# Patient Record
Sex: Female | Born: 1990 | Hispanic: No | Marital: Married | Smoking: Never smoker
Health system: Southern US, Community
[De-identification: ages and names within clinical notes are randomized; demographics above are authoritative.]

---

## 2018-07-12 ENCOUNTER — Other Ambulatory Visit: Payer: Self-pay

## 2018-07-12 ENCOUNTER — Encounter (HOSPITAL_BASED_OUTPATIENT_CLINIC_OR_DEPARTMENT_OTHER): Payer: Self-pay | Admitting: Emergency Medicine

## 2018-07-12 ENCOUNTER — Emergency Department (HOSPITAL_BASED_OUTPATIENT_CLINIC_OR_DEPARTMENT_OTHER)
Admission: EM | Admit: 2018-07-12 | Discharge: 2018-07-13 | Disposition: A | Discharge status: Home / Self Care | Payer: BLUE CROSS/BLUE SHIELD | Attending: Emergency Medicine | Admitting: Emergency Medicine

## 2018-07-12 DIAGNOSIS — O209 Hemorrhage in early pregnancy, unspecified: Secondary | ICD-10-CM | POA: Diagnosis present

## 2018-07-12 DIAGNOSIS — R8271 Bacteriuria: Secondary | ICD-10-CM

## 2018-07-12 DIAGNOSIS — Z3201 Encounter for pregnancy test, result positive: Secondary | ICD-10-CM | POA: Insufficient documentation

## 2018-07-12 DIAGNOSIS — O2341 Unspecified infection of urinary tract in pregnancy, first trimester: Secondary | ICD-10-CM | POA: Diagnosis not present

## 2018-07-12 DIAGNOSIS — Z3A11 11 weeks gestation of pregnancy: Secondary | ICD-10-CM | POA: Insufficient documentation

## 2018-07-12 LAB — URINALYSIS, ROUTINE W REFLEX MICROSCOPIC
Bilirubin Urine: NEGATIVE
Glucose, UA: NEGATIVE mg/dL
KETONES UR: NEGATIVE mg/dL
Leukocytes,Ua: NEGATIVE
Nitrite: NEGATIVE
Protein, ur: NEGATIVE mg/dL
Specific Gravity, Urine: <1.005 — ABNORMAL LOW (ref 1.005–1.030)
pH: 6.5 (ref 5.0–8.0)

## 2018-07-12 LAB — URINALYSIS, MICROSCOPIC (REFLEX)

## 2018-07-12 LAB — PREGNANCY, URINE: Preg Test, Ur: POSITIVE — AB

## 2018-07-12 MED ORDER — ALBUTEROL SULFATE (2.5 MG/3ML) 0.083% IN NEBU: 2.5000 mg | INHALATION_SOLUTION | Freq: Once | RESPIRATORY_TRACT | Status: DC

## 2018-07-12 NOTE — ED Triage Notes (Signed)
Pt states she is [redacted] weeks pregnant and she started bleeding one hour ago.

## 2018-07-13 ENCOUNTER — Ambulatory Visit (HOSPITAL_BASED_OUTPATIENT_CLINIC_OR_DEPARTMENT_OTHER)
Admission: RE | Admit: 2018-07-13 | Discharge: 2018-07-13 | Disposition: A | Discharge status: Home / Self Care | Payer: BLUE CROSS/BLUE SHIELD | Source: Ambulatory Visit | Attending: Emergency Medicine | Admitting: Emergency Medicine

## 2018-07-13 ENCOUNTER — Encounter (HOSPITAL_BASED_OUTPATIENT_CLINIC_OR_DEPARTMENT_OTHER): Payer: Self-pay

## 2018-07-13 DIAGNOSIS — O9989 Other specified diseases and conditions complicating pregnancy, childbirth and the puerperium: Secondary | ICD-10-CM | POA: Insufficient documentation

## 2018-07-13 DIAGNOSIS — Z3A11 11 weeks gestation of pregnancy: Secondary | ICD-10-CM

## 2018-07-13 DIAGNOSIS — R102 Pelvic and perineal pain: Secondary | ICD-10-CM

## 2018-07-13 DIAGNOSIS — O209 Hemorrhage in early pregnancy, unspecified: Secondary | ICD-10-CM | POA: Insufficient documentation

## 2018-07-13 LAB — CBC WITH DIFFERENTIAL/PLATELET
Abs Immature Granulocytes: 0.02 10*3/uL (ref 0.00–0.07)
Basophils Absolute: 0 10*3/uL (ref 0.0–0.1)
Basophils Relative: 0 %
EOS ABS: 0.1 10*3/uL (ref 0.0–0.5)
Eosinophils Relative: 1 %
HEMATOCRIT: 36.6 % (ref 36.0–46.0)
Hemoglobin: 12.1 g/dL (ref 12.0–15.0)
Immature Granulocytes: 0 %
Lymphocytes Relative: 38 %
Lymphs Abs: 2.9 10*3/uL (ref 0.7–4.0)
MCH: 28 pg (ref 26.0–34.0)
MCHC: 33.1 g/dL (ref 30.0–36.0)
MCV: 84.7 fL (ref 80.0–100.0)
Monocytes Absolute: 0.5 10*3/uL (ref 0.1–1.0)
Monocytes Relative: 7 %
Neutro Abs: 4 10*3/uL (ref 1.7–7.7)
Neutrophils Relative %: 54 %
PLATELETS: 264 10*3/uL (ref 150–400)
RBC: 4.32 MIL/uL (ref 3.87–5.11)
RDW: 13.2 % (ref 11.5–15.5)
WBC: 7.6 10*3/uL (ref 4.0–10.5)
nRBC: 0 % (ref 0.0–0.2)

## 2018-07-13 LAB — HCG, QUANTITATIVE, PREGNANCY: hCG, Beta Chain, Quant, S: 72206 m[IU]/mL — ABNORMAL HIGH

## 2018-07-13 LAB — WET PREP, GENITAL
Clue Cells Wet Prep HPF POC: NONE SEEN
Sperm: NONE SEEN
Trich, Wet Prep: NONE SEEN
Yeast Wet Prep HPF POC: NONE SEEN

## 2018-07-13 MED ORDER — CEPHALEXIN 500 MG PO CAPS: 500.0000 mg | ORAL_CAPSULE | Freq: Four times a day (QID) | ORAL | 0 refills | Status: AC

## 2018-07-13 NOTE — Discharge Instructions (Signed)
We've scheduled an outpatient ultrasound for you tomorrow. Come to the ER here at [facility] during business hours and notify them of your visit and ultrasound order.  Take a prenatal vitamin.  We also noticed a small amount of bacteria in your urine. This should be treated during pregnancy and I've prescribed an antibiotic.

## 2018-07-13 NOTE — ED Provider Notes (Signed)
[facility] EMERGENCY DEPARTMENT Provider Note   CSN: [id number] Arrival date & time: 07/12/18  2116    History   Chief Complaint Chief Complaint  Patient presents with  . Vaginal Bleeding    HPI Kimberly Morrison is a 28 y.o. female.     HPI   28 yo G2P1 at estimated [redacted] wk GA here w/ vaginal bleeding. Pt states she was in her usual state of health until just prior to arrival. She was sitting down when she felt warmth in her legs, which she looked down and realized was vaginal bleeding. She went to the bathroom and had a small to mod amount of vag bleeding. It then resolved and has not returned. Denies any vaginal discharge. No abd pain or cramping. No nausea currently, has had vomiting in AMs which occurred with her other pregnancy as well. No dyspareunia. No other complaints. No fever or chills. No h/o miscarriages.  History reviewed. No pertinent past medical history.  There are no active problems to display for this patient.   Past Surgical History:  Procedure Laterality Date  . CESAREAN SECTION       OB History    Gravida  1   Para      Term      Preterm      AB      Living        SAB      TAB      Ectopic      Multiple      Live Births               Home Medications    Prior to Admission medications   Medication Sig Start Date End Date Taking? Authorizing Provider  cephALEXin (KEFLEX) 500 MG capsule Take 1 capsule (500 mg total) by mouth 4 (four) times daily for 5 days. 07/13/18 07/18/18  Shaune Pollack, MD    Family History History reviewed. No pertinent family history.  Social History Social History   Tobacco Use  . Smoking status: Never Smoker  . Smokeless tobacco: Never Used  Substance Use Topics  . Alcohol use: Never    Frequency: Never  . Drug use: Never     Allergies   Patient has no known allergies.   Review of Systems Review of Systems  Constitutional: Negative for chills and fever.  HENT: Negative  for congestion, rhinorrhea and sore throat.   Eyes: Negative for visual disturbance.  Respiratory: Negative for cough, shortness of breath and wheezing.   Cardiovascular: Negative for chest pain and leg swelling.  Gastrointestinal: Negative for abdominal pain, diarrhea, nausea and vomiting.  Genitourinary: Positive for vaginal bleeding. Negative for dysuria, flank pain and vaginal discharge.  Musculoskeletal: Negative for neck pain.  Skin: Negative for rash.  Allergic/Immunologic: Negative for immunocompromised state.  Neurological: Negative for syncope and headaches.  Hematological: Does not bruise/bleed easily.  All other systems reviewed and are negative.    Physical Exam Updated Vital Signs BP 122/76 (BP Location: Right Arm)   Pulse 68   Temp 98.4 F (36.9 C)   Resp 16   Ht 5\' 2"  (1.575 m)   Wt 74.8 kg   LMP 04/18/2018   SpO2 100%   BMI 30.18 kg/m   Physical Exam Vitals signs and nursing note reviewed.  Constitutional:      General: She is not in acute distress.    Appearance: She is well-developed.  HENT:     Head: Normocephalic and atraumatic.  Eyes:     Conjunctiva/sclera: Conjunctivae normal.  Neck:     Musculoskeletal: Neck supple.  Cardiovascular:     Rate and Rhythm: Normal rate and regular rhythm.     Heart sounds: Normal heart sounds. No murmur. No friction rub.  Pulmonary:     Effort: Pulmonary effort is normal. No respiratory distress.     Breath sounds: Normal breath sounds. No wheezing or rales.  Abdominal:     General: There is no distension.     Palpations: Abdomen is soft.     Tenderness: There is no abdominal tenderness.     Comments: Soft, non-tender, no rebound or guarding.  Genitourinary:    Comments: Small amount of dark red/brown blood in vaginal vault. Os is closed. No CMT. No adnexal fullness. Skin:    General: Skin is warm.     Capillary Refill: Capillary refill takes less than 2 seconds.  Neurological:     Mental Status: She is  alert and oriented to person, place, and time.     Motor: No abnormal muscle tone.      ED Treatments / Results  Labs (all labs ordered are listed, but only abnormal results are displayed) Labs Reviewed  WET PREP, GENITAL - Abnormal; Notable for the following components:      Result Value   WBC, Wet Prep HPF POC MODERATE (*)    All other components within normal limits  PREGNANCY, URINE - Abnormal; Notable for the following components:   Preg Test, Ur POSITIVE (*)    All other components within normal limits  URINALYSIS, ROUTINE W REFLEX MICROSCOPIC - Abnormal; Notable for the following components:   Color, Urine STRAW (*)    Specific Gravity, Urine <1.005 (*)    Hgb urine dipstick LARGE (*)    All other components within normal limits  HCG, QUANTITATIVE, PREGNANCY - Abnormal; Notable for the following components:   hCG, Beta Chain, Quant, S 72,206 (*)    All other components within normal limits  URINALYSIS, MICROSCOPIC (REFLEX) - Abnormal; Notable for the following components:   Bacteria, UA FEW (*)    All other components within normal limits  CBC WITH DIFFERENTIAL/PLATELET    EKG None  Radiology No results found.  Procedures Procedures (including critical care time)  EMERGENCY DEPARTMENT [place] PREGNANCY "Study: Limited Ultrasound of the Pelvis for Pregnancy"  INDICATIONS:Pregnancy(required) Multiple views of the uterus and pelvic cavity were obtained in real-time with a multi-frequency probe.  APPROACH:Transabdominal  PERFORMED BY: Myself IMAGES ARCHIVED?: Yes LIMITATIONS: Body habitus PREGNANCY FREE FLUID: None ADNEXAL FINDINGS: No obvious cyst or free fluid GESTATIONAL AGE, ESTIMATE: 14w FETAL HEART RATE: 160 INTERPRETATION: Fetal heart activity seen  Medications Ordered in ED Medications - No data to display   Initial Impression / Assessment and Plan / ED Course  I have reviewed the triage vital signs and the nursing notes.  Pertinent labs & imaging  results that were available during my care of the patient were reviewed by me and considered in my medical decision making (see chart for details).        27 yo F here w/ painless vaginal bleeding. Suspect threatened AB versus subchorionic hemorrhage. BSUS shows what appears to be IUP w/ HR 150s, good fetal movement noted. No obvious adnexal masses, cysts, or fluid. No large free fluid in cul-de-sac. Labs reassuring, Hgb is stable. Labs reviewed from last pregnancy - pt is Rh +, Rhogam not indicated. No signs of yeast infection on Wet Prep. Small bacteria on  UA - will tx. Will refer for formal TVUS in AM given no signs of ectopic or HD instability, and resolution of bleeding. Prenatals started. She has OB f/u in 48 hours already scheduled.  Final Clinical Impressions(s) / ED Diagnoses   Final diagnoses:  Bacteriuria  First trimester bleeding    ED Discharge Orders         Ordered    cephALEXin (KEFLEX) 500 MG capsule  4 times daily     07/13/18 0107    US OB LESS THAN 14 WEEKS WITH OB TRANSVAGINAL     07/13/18 0107           Shaune Pollack, MD 07/13/18 [phone]

## 2020-01-17 IMAGING — US OBSTETRIC <14 WK US AND TRANSVAGINAL OB US
1 series · 14 of 28 positions shown · non-contrast
Comparison: None.

CLINICAL DATA: Pelvic pain and vaginal bleeding began yesterday.
[place]itative [place] [address]. By LMP of 04/18/2018 the patient is
12 weeks 2 days. EDC by LMP is 01/23/2019.

EXAM:
OBSTETRIC <14 WK US AND TRANSVAGINAL OB US
TECHNIQUE: Both transabdominal and transvaginal ultrasound examinations were
performed for complete evaluation of the gestation as well as the
maternal uterus, adnexal regions, and pelvic cul-de-sac.
Transvaginal technique was performed to assess early pregnancy.

[Series 1: obstetric <14 wk us and transvaginal ob us · 112 acquisitions, 14 frames shown]
[im 5/112]
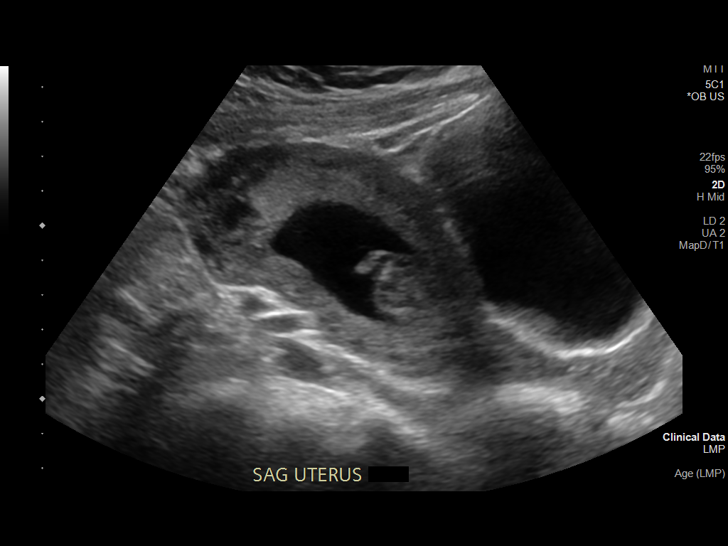
[im 13/112]
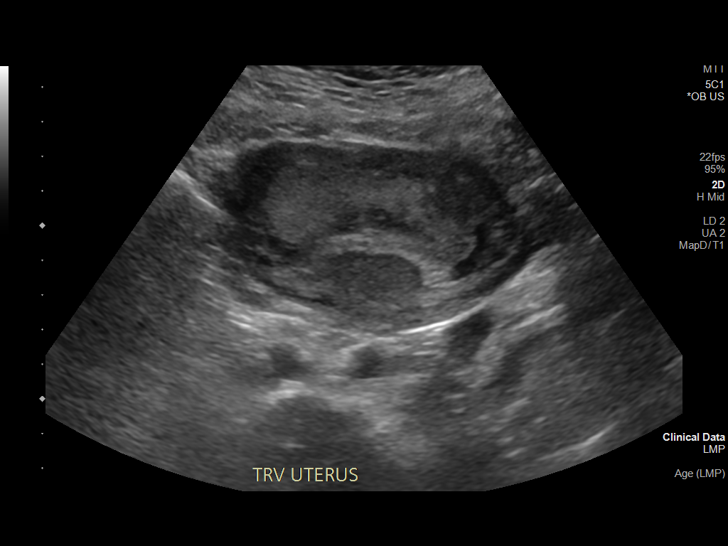
[im 21/112]
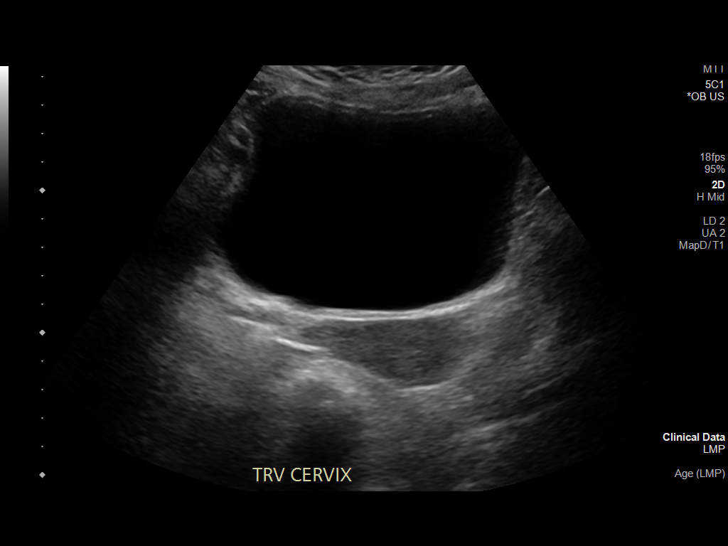
[im 29/112]
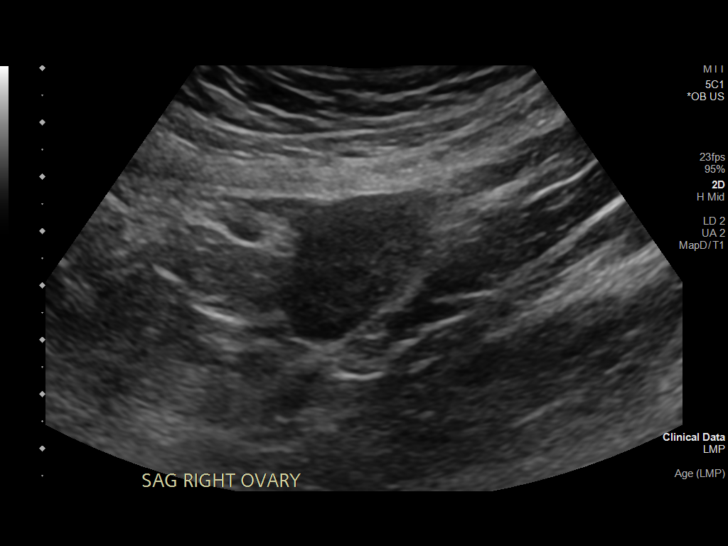
[im 38/112]
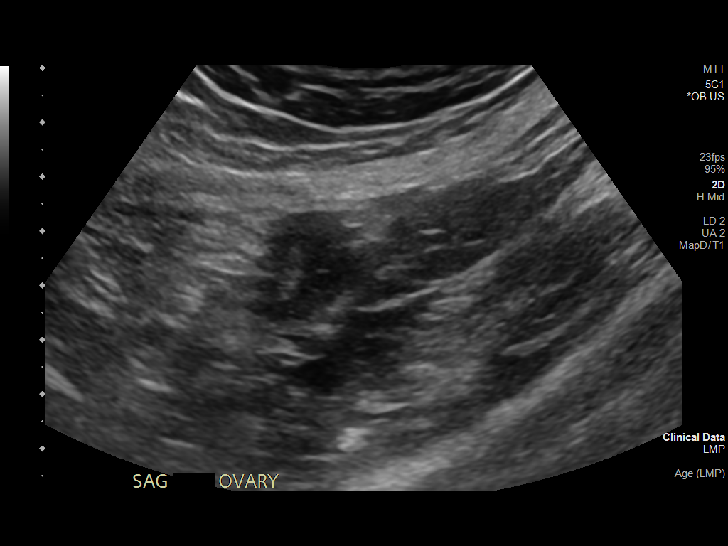
[im 46/112]
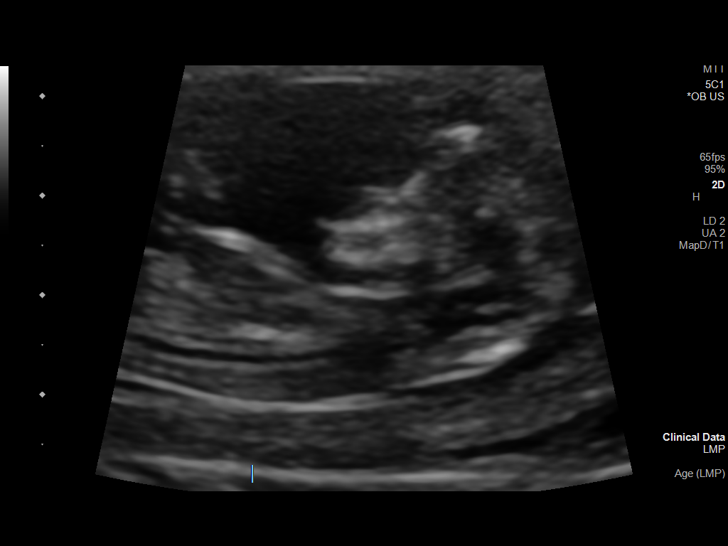
[im 54/112]
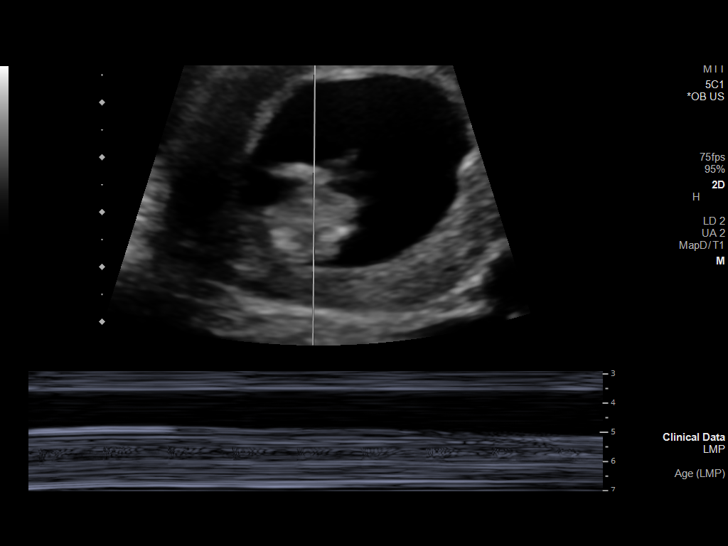
[im 62/112]
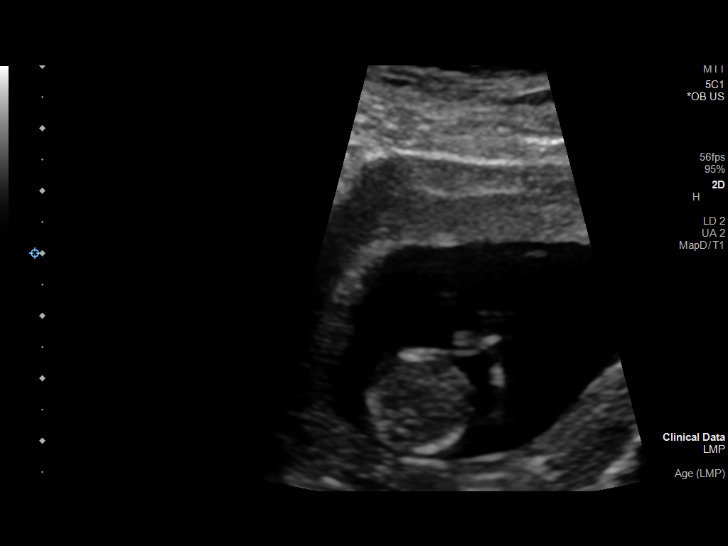
[im 70/112]
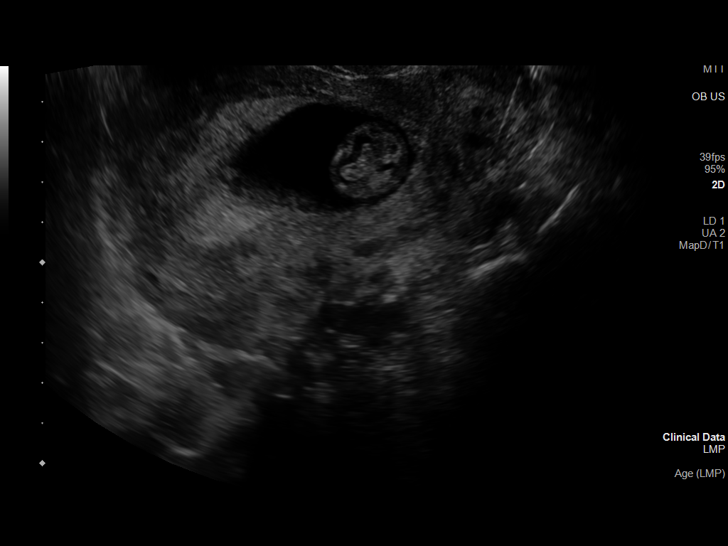
[im 79/112]
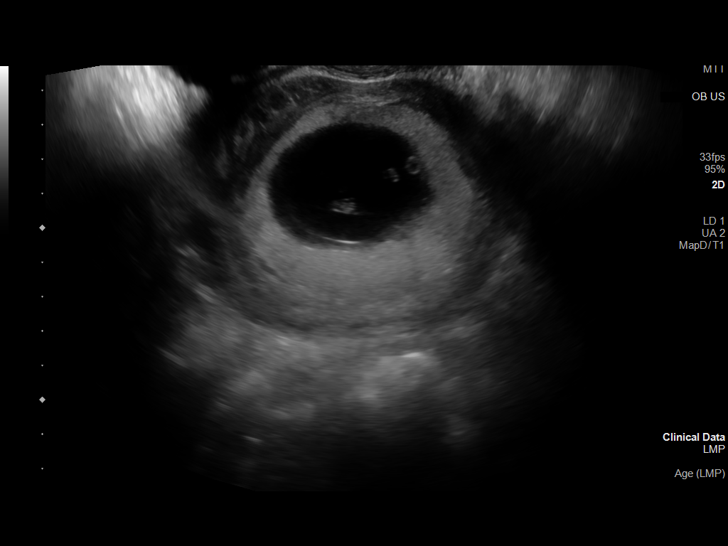
[im 87/112]
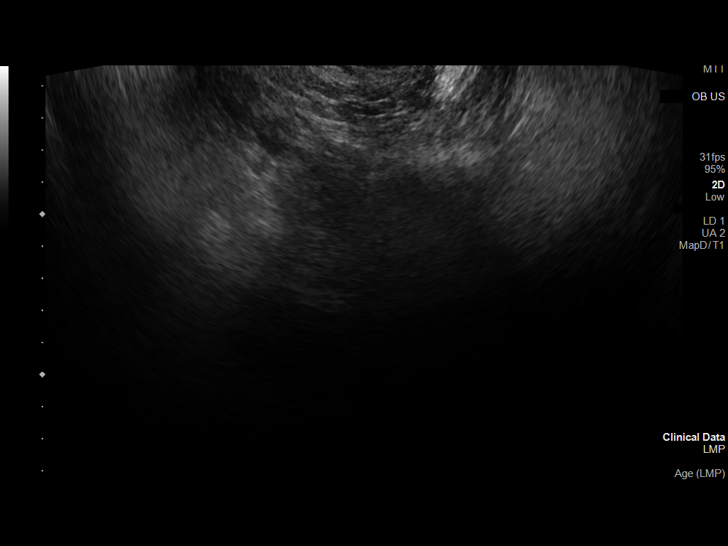
[im 95/112]
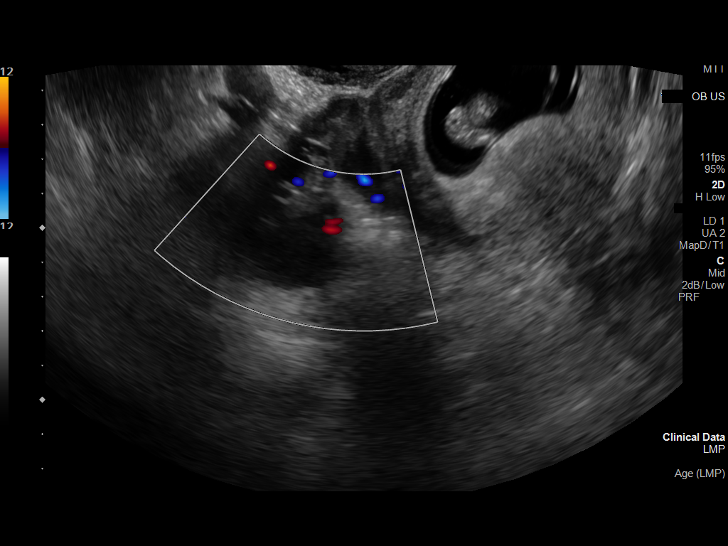
[im 103/112]
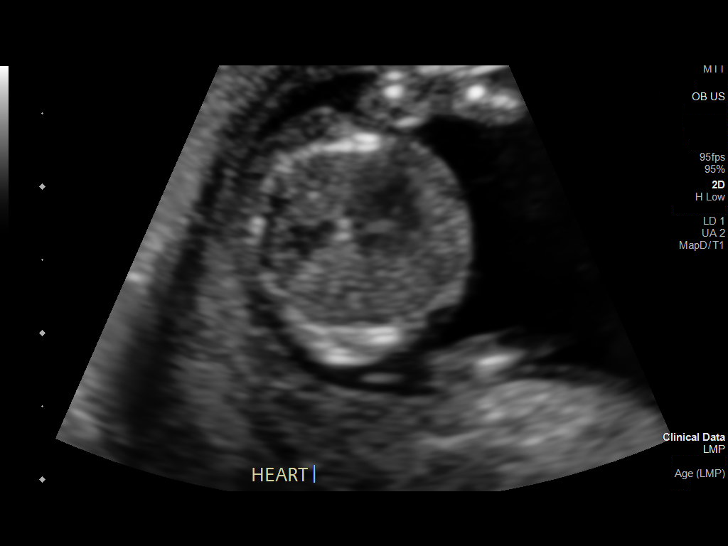
[im 112/112]
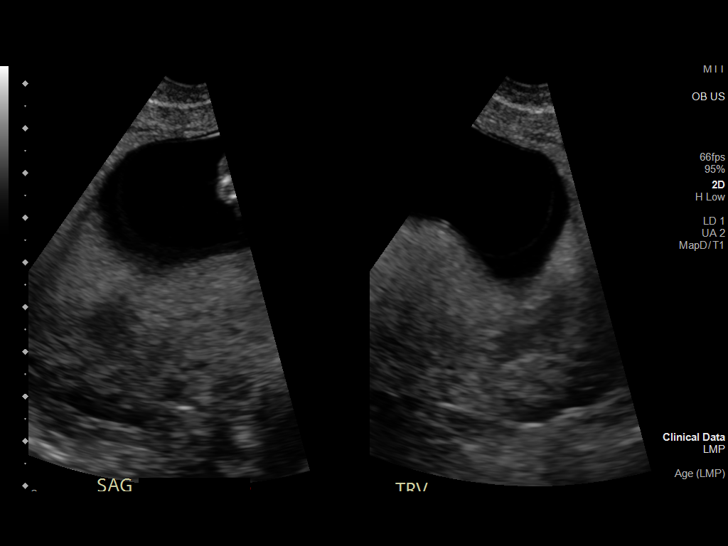

[14 of 28 positions shown; findings below may reference images not displayed]

FINDINGS: Intrauterine gestational sac: Single

Yolk sac:  Visualized.

Embryo:  Visualized.

Cardiac Activity: Visualized.

Heart Rate: 160 bpm

CRL:  52.2 mm   11 w   6 d                  US EDC: 01/26/2019

Subchorionic hemorrhage:  Small subchorionic hemorrhage is present.

Maternal uterus/adnexae: Ovaries are normal in appearance. Trace
free pelvic fluid.
IMPRESSION: Single living intrauterine embryo measuring 11 weeks 6 days
confirming clinical EDC of 01/23/2019.

Small subchorionic hemorrhage present.
